# Patient Record
Sex: Female | Born: 1980 | Race: Black or African American | Hispanic: No | Marital: Single | Smoking: Never smoker
Health system: Southern US, Community
[De-identification: ages and names within clinical notes are randomized; demographics above are authoritative.]

## PROBLEM LIST (undated history)

## (undated) DIAGNOSIS — Z789 Other specified health status: Secondary | ICD-10-CM

## (undated) HISTORY — PX: APPENDECTOMY: SHX54

## (undated) HISTORY — DX: Other specified health status: Z78.9

---

## 2008-11-06 ENCOUNTER — Emergency Department (HOSPITAL_COMMUNITY): Admission: EM | Admit: 2008-11-06 | Discharge: 2008-11-06 | Payer: Self-pay | Admitting: Family Medicine

## 2010-11-06 LAB — POCT URINALYSIS DIP (DEVICE)
Glucose, UA: NEGATIVE mg/dL
Hgb urine dipstick: NEGATIVE
Ketones, ur: NEGATIVE mg/dL
Protein, ur: NEGATIVE mg/dL
Specific Gravity, Urine: >=1.03 (ref 1.005–1.030)
Urobilinogen, UA: 0.2 mg/dL (ref 0.0–1.0)

## 2010-11-06 LAB — POCT PREGNANCY, URINE: Preg Test, Ur: NEGATIVE

## 2015-05-25 ENCOUNTER — Emergency Department (HOSPITAL_COMMUNITY)
Admission: EM | Admit: 2015-05-25 | Discharge: 2015-05-25 | Disposition: A | Discharge status: Home / Self Care | Payer: Self-pay | Attending: Emergency Medicine | Admitting: Emergency Medicine

## 2015-05-25 ENCOUNTER — Encounter (HOSPITAL_COMMUNITY): Payer: Self-pay | Admitting: *Deleted

## 2015-05-25 ENCOUNTER — Emergency Department (HOSPITAL_COMMUNITY): Payer: Self-pay

## 2015-05-25 DIAGNOSIS — O9989 Other specified diseases and conditions complicating pregnancy, childbirth and the puerperium: Secondary | ICD-10-CM | POA: Insufficient documentation

## 2015-05-25 DIAGNOSIS — O210 Mild hyperemesis gravidarum: Secondary | ICD-10-CM | POA: Insufficient documentation

## 2015-05-25 DIAGNOSIS — Z3A13 13 weeks gestation of pregnancy: Secondary | ICD-10-CM | POA: Insufficient documentation

## 2015-05-25 DIAGNOSIS — Z3490 Encounter for supervision of normal pregnancy, unspecified, unspecified trimester: Secondary | ICD-10-CM

## 2015-05-25 DIAGNOSIS — R1031 Right lower quadrant pain: Secondary | ICD-10-CM | POA: Insufficient documentation

## 2015-05-25 DIAGNOSIS — Z79899 Other long term (current) drug therapy: Secondary | ICD-10-CM | POA: Insufficient documentation

## 2015-05-25 DIAGNOSIS — O200 Threatened abortion: Secondary | ICD-10-CM | POA: Insufficient documentation

## 2015-05-25 LAB — URINE MICROSCOPIC-ADD ON

## 2015-05-25 LAB — ABO/RH: ABO/RH(D): O POS

## 2015-05-25 LAB — URINALYSIS, ROUTINE W REFLEX MICROSCOPIC
GLUCOSE, UA: NEGATIVE mg/dL
HGB URINE DIPSTICK: NEGATIVE
Ketones, ur: >80 mg/dL — AB
Nitrite: NEGATIVE
PH: 6 (ref 5.0–8.0)
Protein, ur: 30 mg/dL — AB
SPECIFIC GRAVITY, URINE: 1.033 — AB (ref 1.005–1.030)
Urobilinogen, UA: 0.2 mg/dL (ref 0.0–1.0)

## 2015-05-25 LAB — CBC
HEMATOCRIT: 33.4 % — AB (ref 36.0–46.0)
Hemoglobin: 11.2 g/dL — ABNORMAL LOW (ref 12.0–15.0)
MCH: 29.9 pg (ref 26.0–34.0)
MCHC: 33.5 g/dL (ref 30.0–36.0)
MCV: 89.3 fL (ref 78.0–100.0)
Platelets: 244 10*3/uL (ref 150–400)
RBC: 3.74 MIL/uL — ABNORMAL LOW (ref 3.87–5.11)
RDW: 13.8 % (ref 11.5–15.5)
WBC: 6 10*3/uL (ref 4.0–10.5)

## 2015-05-25 LAB — CBC WITH DIFFERENTIAL/PLATELET
Basophils Absolute: 0 10*3/uL (ref 0.0–0.1)
Basophils Relative: 0 %
EOS PCT: 0 %
Eosinophils Absolute: 0 10*3/uL (ref 0.0–0.7)
HCT: 33.4 % — ABNORMAL LOW (ref 36.0–46.0)
HEMOGLOBIN: 11 g/dL — AB (ref 12.0–15.0)
Lymphocytes Relative: 23 %
Lymphs Abs: 1.5 10*3/uL (ref 0.7–4.0)
MCH: 29.3 pg (ref 26.0–34.0)
MCHC: 32.9 g/dL (ref 30.0–36.0)
MCV: 88.8 fL (ref 78.0–100.0)
Monocytes Absolute: 0.6 10*3/uL (ref 0.1–1.0)
Monocytes Relative: 10 %
NEUTROS PCT: 67 %
Neutro Abs: 4.4 10*3/uL (ref 1.7–7.7)
PLATELETS: 239 10*3/uL (ref 150–400)
RBC: 3.76 MIL/uL — AB (ref 3.87–5.11)
RDW: 13.9 % (ref 11.5–15.5)
WBC: 6.6 10*3/uL (ref 4.0–10.5)

## 2015-05-25 LAB — COMPREHENSIVE METABOLIC PANEL
ALBUMIN: 3.9 g/dL (ref 3.5–5.0)
ALT: 10 U/L — ABNORMAL LOW (ref 14–54)
AST: 15 U/L (ref 15–41)
Alkaline Phosphatase: 44 U/L (ref 38–126)
Anion gap: 11 (ref 5–15)
BILIRUBIN TOTAL: 0.7 mg/dL (ref 0.3–1.2)
BUN: 6 mg/dL (ref 6–20)
CO2: 19 mmol/L — AB (ref 22–32)
Calcium: 9.2 mg/dL (ref 8.9–10.3)
Chloride: 102 mmol/L (ref 101–111)
Creatinine, Ser: 0.53 mg/dL (ref 0.44–1.00)
GFR calc Af Amer: >60 mL/min (ref >60)
GFR calc non Af Amer: >60 mL/min (ref >60)
GLUCOSE: 86 mg/dL (ref 65–99)
POTASSIUM: 3.5 mmol/L (ref 3.5–5.1)
SODIUM: 132 mmol/L — AB (ref 135–145)
TOTAL PROTEIN: 7 g/dL (ref 6.5–8.1)

## 2015-05-25 LAB — BASIC METABOLIC PANEL
Anion gap: 10 (ref 5–15)
BUN: 5 mg/dL — AB (ref 6–20)
CHLORIDE: 105 mmol/L (ref 101–111)
CO2: 20 mmol/L — ABNORMAL LOW (ref 22–32)
CREATININE: 0.5 mg/dL (ref 0.44–1.00)
Calcium: 9.4 mg/dL (ref 8.9–10.3)
GFR calc Af Amer: >60 mL/min (ref >60)
GFR calc non Af Amer: >60 mL/min (ref >60)
GLUCOSE: 68 mg/dL (ref 65–99)
POTASSIUM: 4.1 mmol/L (ref 3.5–5.1)
Sodium: 135 mmol/L (ref 135–145)

## 2015-05-25 LAB — WET PREP, GENITAL
Trich, Wet Prep: NONE SEEN
Yeast Wet Prep HPF POC: NONE SEEN

## 2015-05-25 LAB — HCG, QUANTITATIVE, PREGNANCY: hCG, Beta Chain, Quant, S: 122388 m[IU]/mL — ABNORMAL HIGH (ref <5)

## 2015-05-25 LAB — POC URINE PREG, ED: Preg Test, Ur: POSITIVE — AB

## 2015-05-25 LAB — CBG MONITORING, ED: Glucose-Capillary: 70 mg/dL (ref 65–99)

## 2015-05-25 IMAGING — US US OB TRANSVAGINAL
1 series · 14 of 28 positions shown · non-contrast
Comparison: None.

CLINICAL DATA: First trimester pregnancy, acute pelvic pain.

EXAM:
OBSTETRIC <14 WK US AND TRANSVAGINAL OB US
TECHNIQUE: Both transabdominal and transvaginal ultrasound examinations were
performed for complete evaluation of the gestation as well as the
maternal uterus, adnexal regions, and pelvic cul-de-sac.
Transvaginal technique was performed to assess early pregnancy.

[Series 1: us ob transvaginal · 0.26mm/px · 14 of 88 slices shown]
[im 4/88]
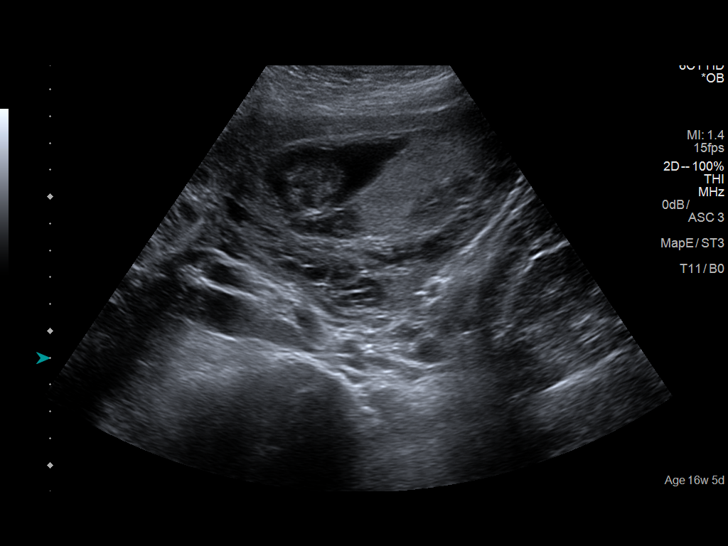
[im 10/88]
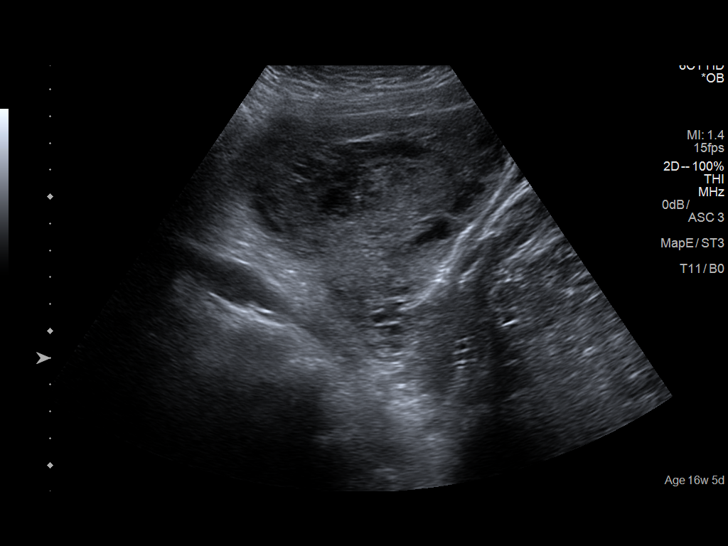
[im 17/88]
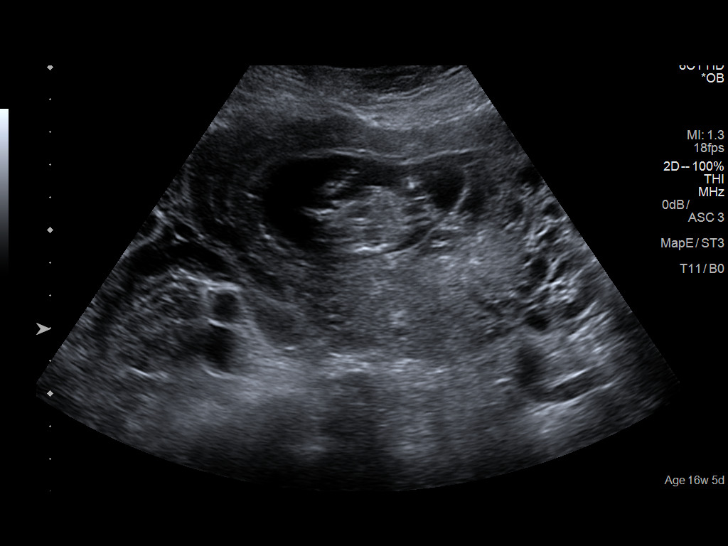
[im 23/88]
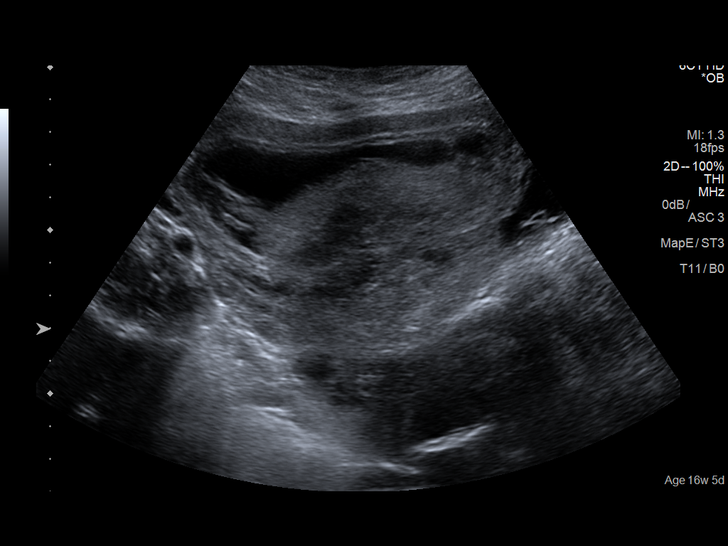
[im 30/88]
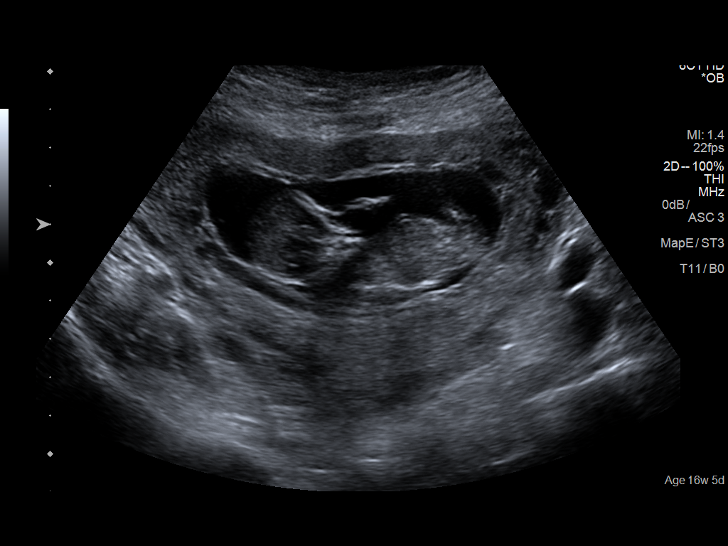
[im 36/88]
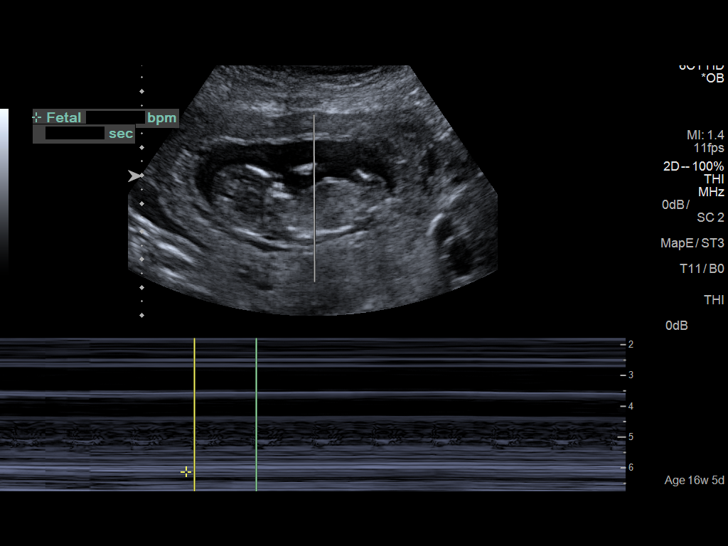
[im 42/88]
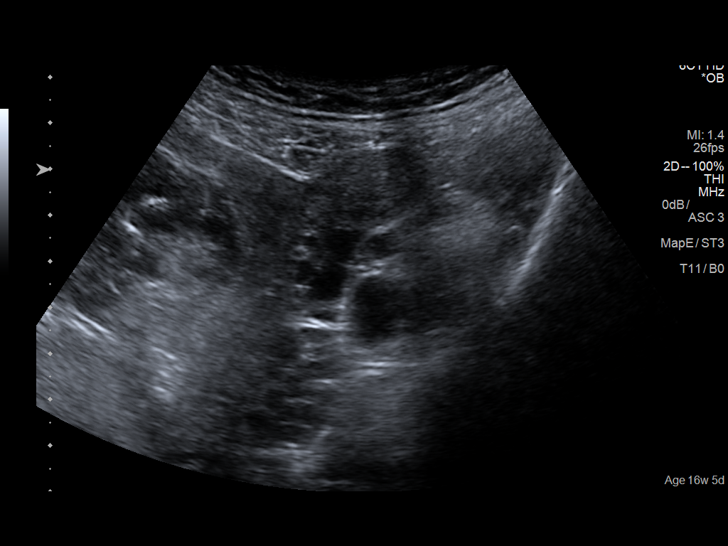
[im 49/88]
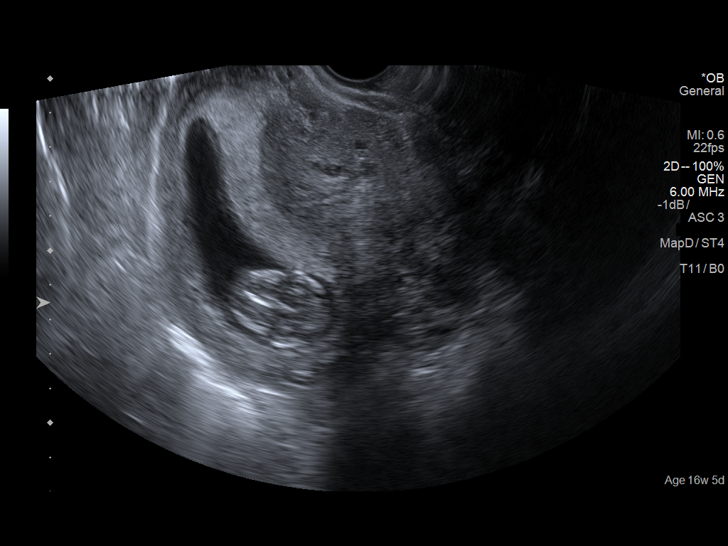
[im 55/88]
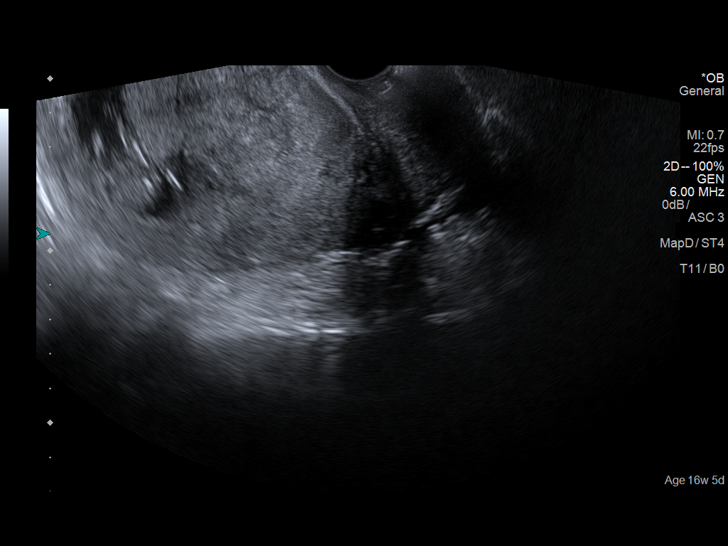
[im 62/88]
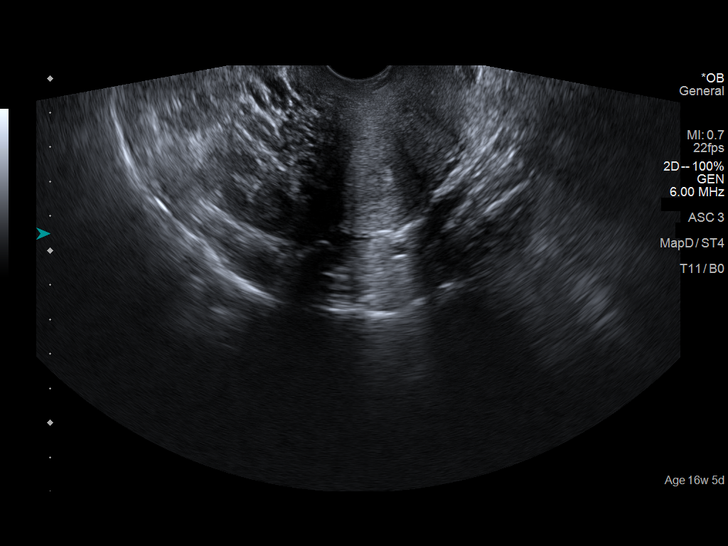
[im 68/88]
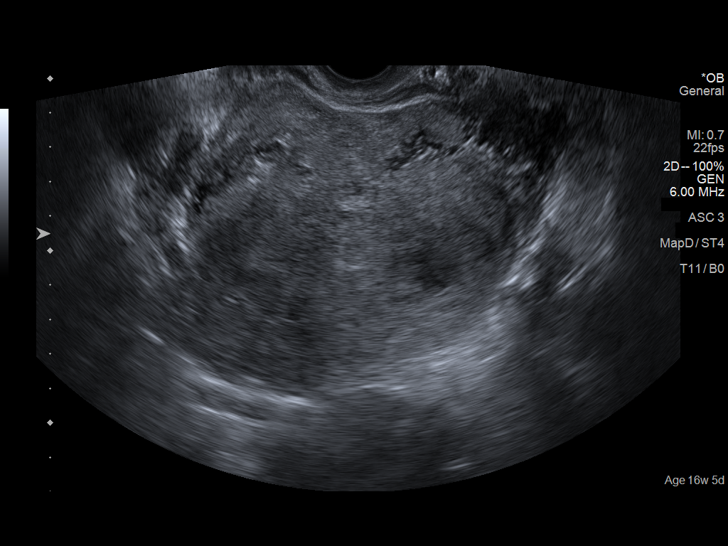
[im 75/88]
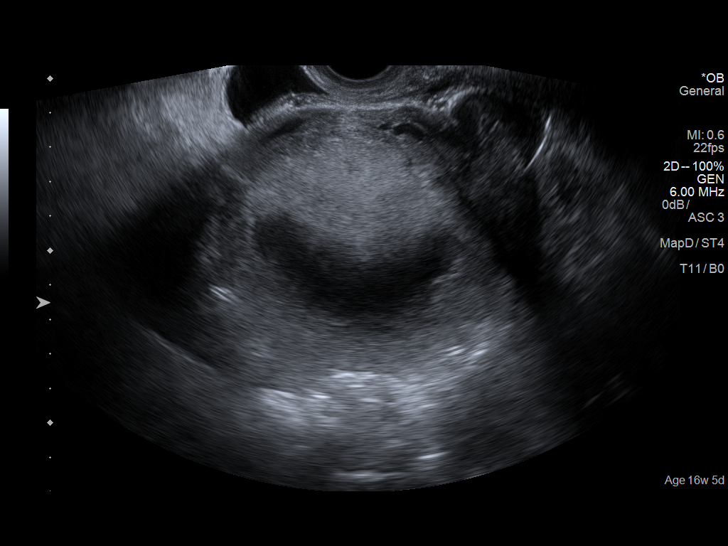
[im 81/88]
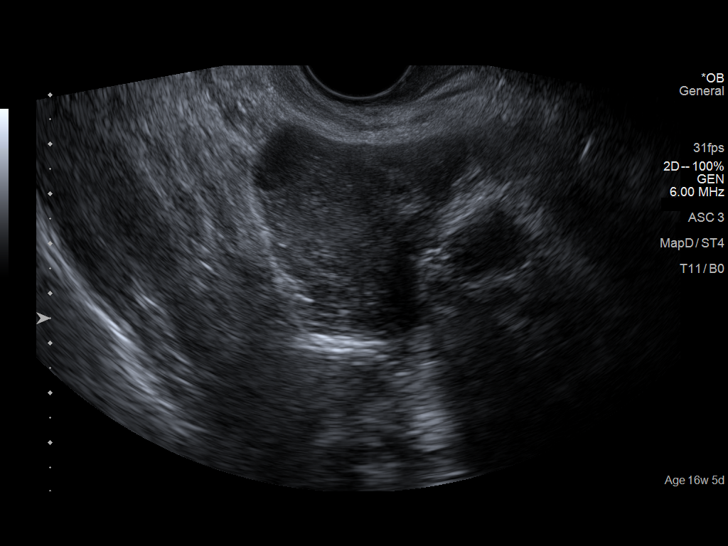
[im 88/88]
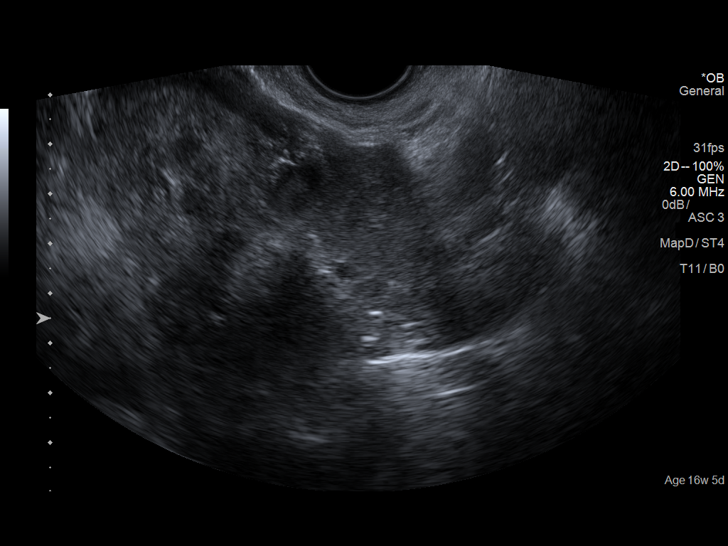

[14 of 28 positions shown; findings below may reference images not displayed]

FINDINGS: Intrauterine gestational sac: Visualized/normal in shape.

Yolk sac:  Not visualized.

Embryo:  Visualized.

Cardiac Activity: Visualized.

Heart Rate: 165  bpm

CRL:  65  mm 12  w 6 d

Maternal uterus/adnexae: Left ovary appears normal. Right ovary is
not visualized. Trace free fluid is noted which most likely is
physiologic.
IMPRESSION: Single live intrauterine gestation of 12 weeks 6 days. This exam was
targeted toward the presenting symptom of pain. Routine fetal
screening was not performed, and therefore congenital anomalies or
birth defects cannot be excluded on the basis of this exam. It is
recommended that routine screening be performed at the appropriate
time by the patient's obstetrician.

## 2015-05-25 MED ORDER — PROMETHAZINE HCL 25 MG/ML IJ SOLN
12.5000 mg | Freq: Once | INTRAMUSCULAR | Status: AC
  Administered 2015-05-25: 12.5 mg via INTRAVENOUS
  Filled 2015-05-25: qty 1

## 2015-05-25 MED ORDER — METOCLOPRAMIDE HCL 5 MG/ML IJ SOLN
10.0000 mg | Freq: Once | INTRAMUSCULAR | Status: DC
  Filled 2015-05-25: qty 2

## 2015-05-25 MED ORDER — DOXYLAMINE SUCCINATE (SLEEP) 25 MG PO TABS: 25.0000 mg | ORAL_TABLET | Freq: Once | ORAL | Status: DC

## 2015-05-25 MED ORDER — PYRIDOXINE HCL 100 MG/ML IJ SOLN
50.0000 mg | Freq: Once | INTRAMUSCULAR | Status: AC
  Administered 2015-05-25: 50 mg via INTRAVENOUS
  Filled 2015-05-25: qty 0.5

## 2015-05-25 MED ORDER — METOCLOPRAMIDE HCL 10 MG PO TABS: 10.0000 mg | ORAL_TABLET | Freq: Once | ORAL | Status: DC

## 2015-05-25 MED ORDER — ONDANSETRON HCL 4 MG/2ML IJ SOLN
4.0000 mg | Freq: Once | INTRAMUSCULAR | Status: DC
Start: 2015-05-25 — End: 2015-05-25

## 2015-05-25 MED ORDER — SODIUM CHLORIDE 0.9 % IV SOLN
INTRAVENOUS | Status: DC
  Administered 2015-05-25: 17:00:00 via INTRAVENOUS

## 2015-05-25 MED ORDER — PROMETHAZINE HCL 12.5 MG PO TABS: 12.5000 mg | ORAL_TABLET | Freq: Four times a day (QID) | ORAL | Status: AC | PRN

## 2015-05-25 MED ORDER — DOXYLAMINE-PYRIDOXINE 10-10 MG PO TBEC: 1.0000 | DELAYED_RELEASE_TABLET | Freq: Every day | ORAL | Status: AC

## 2015-05-25 MED ORDER — SODIUM CHLORIDE 0.9 % IV BOLUS (SEPSIS)
1000.0000 mL | Freq: Once | INTRAVENOUS | Status: AC
  Administered 2015-05-25: 1000 mL via INTRAVENOUS

## 2015-05-25 NOTE — ED Notes (Signed)
Pt reports being [redacted] weeks pregnant, was seen here this am for abd pain and had US done which was normal. Pt reports going home and then sudden onset of heavy vaginal bleeding.

## 2015-05-25 NOTE — Discharge Instructions (Signed)
[facility] symptoms wor[facility].

## 2015-05-25 NOTE — ED Notes (Signed)
No pelvic exam perfromed earlier today

## 2015-05-25 NOTE — ED Notes (Signed)
MD at bedside. 

## 2015-05-25 NOTE — ED Notes (Addendum)
Pt reports no prenatal care at this point in her pregnancy, has an appt next week.  Pt reports she is approximately [redacted] weeks pregnant.

## 2015-05-25 NOTE — ED Notes (Signed)
The pt is 12 weeks preg and she was seen here this am and had a full work-up.  C/o nausea

## 2015-05-25 NOTE — ED Provider Notes (Signed)
Medical screening examination/treatment/procedure(s) were conducted as a shared visit with non-physician practitioner(s) and myself.  I personally evaluated the patient during the encounter.  A 34-year-old female seen here earlier today with pelvic pain and a normal ultrasound. Comes back after being on for approximately 30 minutes she started having vaginal bleeding that persisted until arrival here. Here her vital signs are normal the limit of pelvic cramping but no significant tenderness to palpation. She is alert and oriented no other abnormalities on exam. Ultrasound performed by myself as documented below. Diagnoses likely threatened abortion. Bleeding precautions and close follow-up with obstetrics will be given.  EMERGENCY DEPARTMENT US PREGNANCY "Study: Limited Ultrasound of the Pelvis for Pregnancy"  INDICATIONS:Pregnancy(required), Vaginal bleeding and Pelvic pain Multiple views of the uterus and pelvic cavity were obtained in real-time with a multi-frequency probe.  APPROACH:Transabdominal   PERFORMED BY: Myself  IMAGES ARCHIVED?: Yes  LIMITATIONS: Body habitus  PREGNANCY FREE FLUID: None  ADNEXAL FINDINGS:Left ovary not seen  PREGNANCY FINDINGS: Fetal heart activity seen  INTERPRETATION: Viable intrauterine pregnancy and Pelvic free fluid absent  GESTATIONAL AGE, ESTIMATE: 12.1  FETAL HEART RATE: 167  CPT Codes:  16109-6076815-26 (transabdominal OB)  768-26-52 (transvaginal OB, Reduced level of service for incomplete exam)      Marily MemosJason Lesa Vandall, MD 05/25/15 [id number]

## 2015-05-25 NOTE — ED Notes (Signed)
Patient transported to Ultrasound 

## 2015-05-25 NOTE — ED Notes (Signed)
Pt presents via POV c/o lower abdominal pain/N/V/D x 2 days.  Pt reports she is [redacted] weeks pregnant. Denies vaginal bleeding, reports vaginal discharge.  Pt a x 4, NAD.

## 2015-05-25 NOTE — ED Notes (Signed)
[facility] CALLED TO GET MED SENT

## 2015-05-25 NOTE — ED Provider Notes (Signed)
Arrival Date & Time: 05/25/15 & 0825 History  HPI Limitations: none. Chief Complaint  Patient presents with  . Abdominal Pain   HPI Erica Parks is a 34 y.o. female with a chief complaint of Abdominal Pain  patient presents for assessment of lower pelvic cramping described as mild and radiating into pelvis patient denies fevers or chills increased vaginal discharge and states no vaginal bleeding. Patient has had 5 prior pregnancies and delivered all term vaginally without palpitation or concerns for elevated blood pressure. Patient had miscarriage in April and he is currently pregnant and had urine pregnancy that was positive. Has had no ultrasound screening for intrauterine pregnancy yet. No prenatal care yet. Patient has had nausea vomiting and loose stools for 2 days and states that she is hungry however unable to keep food down.  Past Medical History  I reviewed & agree with nursing's documentation on PMHx, PSHx, SHx and FHx. History reviewed. No pertinent past medical history. Past Surgical History  Procedure Laterality Date  . Appendectomy     Social History   Social History  . Marital Status: Single    Spouse Name: N/A  . Number of Children: N/A  . Years of Education: N/A   Social History Main Topics  . Smoking status: Never Smoker   . Smokeless tobacco: None  . Alcohol Use: No  . Drug Use: No  . Sexual Activity: Not Asked   Other Topics Concern  . None   Social History Narrative  . None   No family history on file.  Review of Systems  Complete ROS obtained and pertinent positive and negatives documented above in HPI. All other ROS negative.  Allergies  Review of patient's allergies indicates no known allergies.  Home Medications   Prior to Admission medications   Medication Sig Start Date End Date Taking? Authorizing Provider  Doxylamine-Pyridoxine (DICLEGIS) 10-10 MG TBEC Take 1 tablet by mouth daily. Take 2 tablets orally on empty stomach at bedtime on  day 1 and 2.  If symptoms persist, take 1 tablet in morning and 2 tablets at bedtime on day 3.  If symptoms persist may increase to MAX 4 tablets per day, administered as 1 tablet in the morning, 1 tablet in the mid-afternoon and 2 tablets at bedtime. 05/25/15   Alvira Monday, MD  prenatal vitamin w/FE, FA (NATACHEW) 29-1 MG CHEW chewable tablet Chew 1 tablet by mouth daily at 12 noon.   Yes Historical Provider, MD    Physical Exam  BP 112/61 mmHg  Pulse 77  Temp(Src) 98.4 F (36.9 C) (Oral)  Resp 17  Ht  (1.854 m)  Wt 175 lb (79.379 kg)  BMI 23.09 kg/m2  SpO2 100% Physical Exam  Constitutional: She is oriented to person, place, and time. She appears well-developed and well-nourished.  Non-toxic appearance. She does not appear ill. No distress.  HENT:  Head: Normocephalic and atraumatic.  Right Ear: External ear normal.  Left Ear: External ear normal.  Eyes: Pupils are equal, round, and reactive to light. No scleral icterus.  Neck: Normal range of motion. Neck supple. No tracheal deviation present.  Cardiovascular: Normal heart sounds and intact distal pulses.   No murmur heard. Pulmonary/Chest: Effort normal and breath sounds normal. No stridor. No respiratory distress. She has no wheezes. She has no rales.  Abdominal: Soft. Bowel sounds are normal. She exhibits no distension. There is tenderness (mild lower pelvic ttp. gravid abdomen.). There is no rebound and no guarding.  Musculoskeletal: Normal range of  motion.  Neurological: She is alert and oriented to person, place, and time. She has normal strength and normal reflexes. No cranial nerve deficit or sensory deficit.  Skin: Skin is warm and dry. No pallor.  Psychiatric: She has a normal mood and affect. Her behavior is normal.    ED Course  Procedures Labs Review Labs Reviewed  COMPREHENSIVE METABOLIC PANEL - Abnormal; Notable for the following:    Sodium 132 (*)    CO2 19 (*)    ALT 10 (*)    All other components  within normal limits  CBC - Abnormal; Notable for the following:    RBC 3.74 (*)    Hemoglobin 11.2 (*)    HCT 33.4 (*)    All other components within normal limits  URINALYSIS, ROUTINE W REFLEX MICROSCOPIC (NOT AT [facility]) - Abnormal; Notable for the following:    Color, Urine AMBER (*)    APPearance CLOUDY (*)    Specific Gravity, Urine 1.033 (*)    Bilirubin Urine SMALL (*)    Ketones, ur >80 (*)    Protein, ur 30 (*)    Leukocytes, UA SMALL (*)    All other components within normal limits  HCG, QUANTITATIVE, PREGNANCY - Abnormal; Notable for the following:    hCG, Beta Chain, Mahalia LongestQuant, S [id number] (*)    All other components within normal limits  URINE MICROSCOPIC-ADD ON - Abnormal; Notable for the following:    Squamous Epithelial / LPF MANY (*)    Crystals CA OXALATE CRYSTALS (*)    All other components within normal limits  POC URINE PREG, ED - Abnormal; Notable for the following:    Preg Test, Ur POSITIVE (*)    All other components within normal limits  CBG MONITORING, ED  ABO/RH    Imaging Review [place] Ob Comp Less 14 Wks  05/25/2015  CLINICAL DATA:  First trimester pregnancy, acute pelvic pain. EXAM: OBSTETRIC <14 WK US AND TRANSVAGINAL OB US TECHNIQUE: Both transabdominal and transvaginal ultrasound examinations were performed for complete evaluation of the gestation as well as the maternal uterus, adnexal regions, and pelvic cul-de-sac. Transvaginal technique was performed to assess early pregnancy. COMPARISON:  None. FINDINGS: Intrauterine gestational sac: Visualized/normal in shape. Yolk sac:  Not visualized. Embryo:  Visualized. Cardiac Activity: Visualized. Heart Rate: 165  bpm CRL:  65  mm 12  w 6 d Maternal uterus/adnexae: Left ovary appears normal. Right ovary is not visualized. Trace free fluid is noted which most likely is physiologic. IMPRESSION: Single live intrauterine gestation of 12 weeks 6 days. This exam was targeted toward the presenting symptom of pain. Routine  fetal screening was not performed, and therefore congenital anomalies or birth defects cannot be excluded on the basis of this exam. It is recommended that routine screening be performed at the appropriate time by the patient's obstetrician. Electronically Signed   By: Lupita RaiderJames  Green Jr, M.D.   On: 05/25/2015 11:24   [place] Ob Transvaginal  05/25/2015  CLINICAL DATA:  First trimester pregnancy, acute pelvic pain. EXAM: OBSTETRIC <14 WK US AND TRANSVAGINAL OB US TECHNIQUE: Both transabdominal and transvaginal ultrasound examinations were performed for complete evaluation of the gestation as well as the maternal uterus, adnexal regions, and pelvic cul-de-sac. Transvaginal technique was performed to assess early pregnancy. COMPARISON:  None. FINDINGS: Intrauterine gestational sac: Visualized/normal in shape. Yolk sac:  Not visualized. Embryo:  Visualized. Cardiac Activity: Visualized. Heart Rate: 165  bpm CRL:  65  mm 12  w 6 d Maternal uterus/adnexae:  Left ovary appears normal. Right ovary is not visualized. Trace free fluid is noted which most likely is physiologic. IMPRESSION: Single live intrauterine gestation of 12 weeks 6 days. This exam was targeted toward the presenting symptom of pain. Routine fetal screening was not performed, and therefore congenital anomalies or birth defects cannot be excluded on the basis of this exam. It is recommended that routine screening be performed at the appropriate time by the patient's obstetrician. Electronically Signed   By: Lupita Raider, M.D.   On: 05/25/2015 11:24    Laboratory and Imaging results were personally reviewed by myself and used in the medical decision making of this patient's treatment and disposition.  EKG Interpretation  EKG Interpretation  Date/Time:    Ventricular Rate:    PR Interval:    QRS Duration:   QT Interval:    QTC Calculation:   R Axis:     Text Interpretation:        MDM  Mckenzy Salazar is a 34 y.o. female with H&P as above.  ED clinical course as follows:  Patient's laboratory work including labs and urine studies reveal no remarkable concerns at this time except for demonstration of dehydration and therefore patient was given intravenous access and supplied with intravenous fluid bolus. Patient states that symptoms are immediately better with fluid bolus along with intravenous administration of antiemetics. Patient is seen tolerating by mouth and no longer divorces significant nausea or abdominal complaints at this time. Patient has appendix removed has no concerning abdominal exam findings vital signs stable and patient is afebrile without toxic appearance. Patient had first trimester ultrasound which revealed intrauterine gestational sac with heart rate fetal of 165 and no other concerning findings on ultrasound therefore patient is appropriate for discharge at this time and will follow up with the saturation gynecology at scheduled appointment on November 3. Patient has no further concerns or questions return precautions discussed in great detail patient states understanding. Patient discharged with diclegis for nausea symptomatic relief.  Clinical Impression:  1. Right lower quadrant abdominal pain   2. Pregnancy   3. Intrauterine pregnancy   4. Morning sickness    Disposition:  D/c to follow up with OB/GYN  Patient care discussed with Dr. Dalene Seltzer, who oversaw their evaluation & treatment & voiced agreement. House Officer: Jonette Eva, MD, Emergency Medicine.  Jonette Eva, MD 05/25/15 1229  Jonette Eva, MD 05/25/15 1229  Alvira Monday, MD 05/26/15 [phone]

## 2015-05-25 NOTE — ED Provider Notes (Signed)
CSN: [id number]   Arrival date & time 05/25/15 1606  History  By signing my name below, I, Erica Parks, attest that this documentation has been prepared under the direction and in the presence of Kerrie Buffalo, NP. Electronically Signed: Bethel Parks, ED Scribe. 05/25/2015. 4:51 PM. Chief Complaint  Patient presents with  . Vaginal Bleeding    HPI Patient is a 34 y.o. female presenting with vaginal bleeding. The history is provided by the patient. No language interpreter was used.  Vaginal Bleeding Quality:  Bright red Severity:  Moderate Onset quality:  Sudden Timing:  Constant Progression:  Unchanged Chronicity:  New Possible pregnancy: Known pregnancy.   Context: spontaneously   Relieved by:  Nothing Worsened by:  Nothing tried Ineffective treatments:  None tried Associated symptoms: abdominal pain and nausea   Risk factors: hx of ectopic pregnancy    Erica Parks is a 34 y.o. [id number]  week 6 day gestation who presents to the Emergency Department complaining of new and constant vaginal bleeding with sudden onset this afternoon. Pt was seen in the ED for evaluation of abdominal cramping where she had an US showing a 12 week 6 day IUP but did not have a pelvic exam. After being discharged she was resting when she felt a "gush" of liquid. After going to the restroom she noticed that she was bleeding. Her abdominal pain is currently rated 9/10 in severity and described as like labor pain. Associate symptoms include nausea and vomiting. She has no local OB/GYN as her other children were Parks elsewhere. Her last pap smear was in September.   History reviewed. No pertinent past medical history.  Past Surgical History  Procedure Laterality Date  . Appendectomy      History reviewed. No pertinent family history.  Social History  Substance Use Topics  . Smoking status: Never Smoker   . Smokeless tobacco: None  . Alcohol Use: No     Review of Systems  Gastrointestinal:  Positive for nausea, vomiting and abdominal pain.  Genitourinary: Positive for vaginal bleeding.  All other systems reviewed and are negative.   Home Medications   Prior to Admission medications   Medication Sig Start Date End Date Taking? Authorizing Provider  Doxylamine-Pyridoxine (DICLEGIS) 10-10 MG TBEC Take 1 tablet by mouth daily. Take 2 tablets orally on empty stomach at bedtime on day 1 and 2.  If symptoms persist, take 1 tablet in morning and 2 tablets at bedtime on day 3.  If symptoms persist may increase to MAX 4 tablets per day, administered as 1 tablet in the morning, 1 tablet in the mid-afternoon and 2 tablets at bedtime. 05/25/15   Alvira Monday, MD  prenatal vitamin w/FE, FA (NATACHEW) 29-1 MG CHEW chewable tablet Chew 1 tablet by mouth daily at 12 noon.    Historical Provider, MD  promethazine (PHENERGAN) 12.5 MG tablet Take 1 tablet (12.5 mg total) by mouth every 6 (six) hours as needed for nausea or vomiting. 05/25/15   Tatiyana Foucher Orlene Och, NP    Allergies  Review of patient's allergies indicates no known allergies.  Triage Vitals: BP 100/59 mmHg  Pulse 94  Temp(Src) 98.3 F (36.8 C) (Oral)  Resp 18  SpO2 99%  Physical Exam  Constitutional: She is oriented to person, place, and time. She appears well-developed and well-nourished. No distress.  HENT:  Head: Normocephalic and atraumatic.  Eyes: EOM are normal.  Neck: Neck supple.  Cardiovascular: Normal rate and regular rhythm.   Pulmonary/Chest: Effort normal.  Abdominal: Soft.  There is tenderness.  Lower abdominal tenderness. Scar midline lower abdomen from previous surgery.   Genitourinary:  External genitalia without lesions, moderate watery red fluid vaginal vault. Cervix closed, uterus consistent with dates.  Musculoskeletal: Normal range of motion.  Neurological: She is alert and oriented to person, place, and time. No cranial nerve deficit.  Skin: Skin is warm and dry.  Psychiatric: She has a normal mood and  affect. Her behavior is normal.  Nursing note and vitals reviewed.   ED Course  Procedures  DIAGNOSTIC STUDIES: Oxygen Saturation is 99% on RA,  normal by my interpretation.    COORDINATION OF CARE: 4:47 PM Discussed treatment plan which includes pelvic exam, lab work, phenergan, and IVF with pt at bedside and pt agreed to the plan.  Labs Review-  Blood type O+ Results for orders placed or performed during the hospital encounter of 05/25/15 (from the past 24 hour(s))  CBC with Differential     Status: Abnormal   Collection Time: 05/25/15  5:28 PM  Result Value Ref Range   WBC 6.6 4.0 - 10.5 K/uL   RBC 3.76 (L) 3.87 - 5.11 MIL/uL   Hemoglobin 11.0 (L) 12.0 - 15.0 g/dL   HCT 16.133.4 (L) 09.636.0 - 04.546.0 %   MCV 88.8 78.0 - 100.0 fL   MCH 29.3 26.0 - 34.0 pg   MCHC 32.9 30.0 - 36.0 g/dL   RDW 40.913.9 81.111.5 - 91.415.5 %   Platelets 239 150 - 400 K/uL   Neutrophils Relative % 67 %   Neutro Abs 4.4 1.7 - 7.7 K/uL   Lymphocytes Relative 23 %   Lymphs Abs 1.5 0.7 - 4.0 K/uL   Monocytes Relative 10 %   Monocytes Absolute 0.6 0.1 - 1.0 K/uL   Eosinophils Relative 0 %   Eosinophils Absolute 0.0 0.0 - 0.7 K/uL   Basophils Relative 0 %   Basophils Absolute 0.0 0.0 - 0.1 K/uL  Wet prep, genital     Status: Abnormal   Collection Time: 05/25/15  5:32 PM  Result Value Ref Range   Yeast Wet Prep HPF POC NONE SEEN NONE SEEN   Trich, Wet Prep NONE SEEN NONE SEEN   Clue Cells Wet Prep HPF POC FEW (A) NONE SEEN   WBC, Wet Prep HPF POC FEW (A) NONE SEEN    Consult with Dr. Jolayne Pantheronstant and will have [facility] if symptoms worsen.  Patient feeling better after phenergan and IV fluids, bleeding is minimal.  Bedside ultrasound by Dr. Clayborne DanaMesner, active IUP with cardiac activity and subjective fluid normal.  MDM  34 y.o. female with sudden gush of bright red blood and abdominal cramping stable for d/c with viable IUP identified on U/S and symptoms improving. Discussed threatened miscarriage and plan of  care. Patient voices understanding and agrees with plan. [facility]   Final diagnoses:  Threatened miscarriage    I personally performed the services described in this documentation, which was scribed in my presence. The recorded information has been reviewed and is accurate.      Bluff DaleHope M Nyaira Hodgens, NP 05/25/15 1935  Marily MemosJason Mesner, MD 05/25/15 [phone]

## 2015-05-25 NOTE — ED Notes (Signed)
Iv started med givem  Spec walked to the lab

## 2015-05-28 LAB — GC/CHLAMYDIA PROBE AMP (~~LOC~~) NOT AT ARMC
CHLAMYDIA, DNA PROBE: NEGATIVE
Neisseria Gonorrhea: NEGATIVE

## 2015-06-25 ENCOUNTER — Emergency Department (HOSPITAL_COMMUNITY)
Admission: EM | Admit: 2015-06-25 | Discharge: 2015-06-25 | Disposition: A | Discharge status: Home / Self Care | Payer: Self-pay | Attending: Emergency Medicine | Admitting: Emergency Medicine

## 2015-06-25 ENCOUNTER — Encounter (HOSPITAL_COMMUNITY): Payer: Self-pay | Admitting: Emergency Medicine

## 2015-06-25 DIAGNOSIS — O200 Threatened abortion: Secondary | ICD-10-CM | POA: Insufficient documentation

## 2015-06-25 DIAGNOSIS — Z3A17 17 weeks gestation of pregnancy: Secondary | ICD-10-CM | POA: Insufficient documentation

## 2015-06-25 LAB — CBC WITH DIFFERENTIAL/PLATELET
Basophils Absolute: 0 10*3/uL (ref 0.0–0.1)
Basophils Relative: 0 %
EOS PCT: 0 %
Eosinophils Absolute: 0 10*3/uL (ref 0.0–0.7)
HCT: 30.1 % — ABNORMAL LOW (ref 36.0–46.0)
Hemoglobin: 9.8 g/dL — ABNORMAL LOW (ref 12.0–15.0)
LYMPHS ABS: 1.2 10*3/uL (ref 0.7–4.0)
LYMPHS PCT: 17 %
MCH: 29.8 pg (ref 26.0–34.0)
MCHC: 32.6 g/dL (ref 30.0–36.0)
MCV: 91.5 fL (ref 78.0–100.0)
MONO ABS: 0.4 10*3/uL (ref 0.1–1.0)
Monocytes Relative: 6 %
Neutro Abs: 5.1 10*3/uL (ref 1.7–7.7)
Neutrophils Relative %: 77 %
PLATELETS: 273 10*3/uL (ref 150–400)
RBC: 3.29 MIL/uL — ABNORMAL LOW (ref 3.87–5.11)
RDW: 14.7 % (ref 11.5–15.5)
WBC: 6.7 10*3/uL (ref 4.0–10.5)

## 2015-06-25 LAB — WET PREP, GENITAL
Sperm: NONE SEEN
Trich, Wet Prep: NONE SEEN
Yeast Wet Prep HPF POC: NONE SEEN

## 2015-06-25 LAB — HCG, QUANTITATIVE, PREGNANCY: hCG, Beta Chain, Quant, S: 39656 m[IU]/mL — ABNORMAL HIGH (ref <5)

## 2015-06-25 NOTE — Discharge Instructions (Signed)

## 2015-06-25 NOTE — ED Provider Notes (Signed)
Pt here with vaginal bleeding x4 days initially scant and now more similar to "the beginning of a period", [redacted]wks pregnant, no prenatal care yet. Requested female provider for pelvic exam, therefore I was asked by the Resident over her care Erica Parks(Erica Parks)  to perform this. Please see his note for discussion of patient's eval/care aside from the pelvic which is noted below.  Physical Exam  BP 102/69 mmHg  Pulse 77  Temp(Src) 98.2 F (36.8 C) (Oral)  Resp 16  Ht 6\' 1"  (1.854 m)  Wt 79.379 kg  BMI 23.09 kg/m2  SpO2 100%  Physical Exam SEE PELVIC EXAM BELOW- in short, mild amount of dark vaginal blood in vault which appears to be coming from the cervical os where there is some brighter red blood noted at the os, but os is closed.  ED Course  Pelvic exam Date/Time: 06/25/2015 10:57 AM Performed by: Erica Parks, Erica Parks Authorized by: Erica Parks, Erica Parks Consent: Verbal consent obtained. Risks and benefits: risks, benefits and alternatives were discussed Consent given by: patient Patient understanding: patient states understanding of the procedure being performed Patient consent: the patient's understanding of the procedure matches consent given Patient identity confirmed: verbally with patient Preparation: Patient was prepped and draped in the usual sterile fashion. Local anesthesia used: no Patient sedated: no Patient tolerance: Patient tolerated the procedure well with no immediate complications Comments: Chaperone present for exam. No rashes, lesions, or tenderness to external genitalia. No erythema, injury, or tenderness to vaginal mucosa. No obvious vaginal discharge but mild amount of dark vaginal blood noted within vaginal vault, which appears to be coming from the cervical os with some scant bright red blood noted at the os, which is closed. No cervical friability or discharge from cervical os. Bimanual exam not performed due to pregnancy and uncertainty of placenta  location.      Erica RavensMercedes Camprubi-Soms, PA-C 06/25/15 1059  Erica PorterMark James, MD 06/26/15 2242

## 2015-06-25 NOTE — ED Provider Notes (Signed)
CSN: [id number]     Arrival date & time 06/25/15  [id number] History   First MD Initiated Contact with Patient 06/25/15 [phone]     Chief Complaint  Patient presents with  . Vaginal Bleeding    Patient is a 34 y.o. female presenting with vaginal bleeding. The history is provided by the patient.  Vaginal Bleeding Quality:  Dark red and spotting Severity:  Mild Onset quality:  Gradual Duration:  2 days Timing:  Intermittent Chronicity:  New Number of pads used:  2 Possible pregnancy: yes   Relieved by:  Nothing Associated symptoms: abdominal pain   Associated symptoms: no back pain, no dizziness, no dysuria, no fever and no nausea     History reviewed. No pertinent past medical history. Past Surgical History  Procedure Laterality Date  . Appendectomy     No family history on file. Social History  Substance Use Topics  . Smoking status: Never Smoker   . Smokeless tobacco: None  . Alcohol Use: No   OB History    Gravida Para Term Preterm AB TAB SAB Ectopic Multiple Living   1              Review of Systems  Constitutional: Negative for fever.  HENT: Negative for rhinorrhea and sore throat.   Eyes: Negative for visual disturbance.  Respiratory: Negative for chest tightness and shortness of breath.   Cardiovascular: Negative for chest pain and palpitations.  Gastrointestinal: Positive for abdominal pain. Negative for nausea, vomiting and constipation.  Genitourinary: Positive for vaginal bleeding. Negative for dysuria and hematuria.  Musculoskeletal: Negative for back pain and neck pain.  Skin: Negative for rash.  Neurological: Negative for dizziness and headaches.  Psychiatric/Behavioral: Negative for confusion.  All other systems reviewed and are negative.     Allergies  Review of patient's allergies indicates no known allergies.  Home Medications   Prior to Admission medications   Medication Sig Start Date End Date Taking? Authorizing Provider   Doxylamine-Pyridoxine (DICLEGIS) 10-10 MG TBEC Take 1 tablet by mouth daily. Take 2 tablets orally on empty stomach at bedtime on day 1 and 2.  If symptoms persist, take 1 tablet in morning and 2 tablets at bedtime on day 3.  If symptoms persist may increase to MAX 4 tablets per day, administered as 1 tablet in the morning, 1 tablet in the mid-afternoon and 2 tablets at bedtime. Patient not taking: Reported on 06/25/2015 05/25/15   Alvira MondayErin Schlossman, MD  prenatal vitamin w/FE, FA (NATACHEW) 29-1 MG CHEW chewable tablet Chew 1 tablet by mouth daily at 12 noon.    Historical Provider, MD  promethazine (PHENERGAN) 12.5 MG tablet Take 1 tablet (12.5 mg total) by mouth every 6 (six) hours as needed for nausea or vomiting. Patient not taking: Reported on 06/25/2015 05/25/15   Janne NapoleonHope M Neese, NP   BP 96/57 mmHg  Pulse 72  Temp(Src) 98.2 F (36.8 C) (Oral)  Resp 16  Ht 6\' 1"  (1.854 m)  Wt 79.379 kg  BMI 23.09 kg/m2  SpO2 100% Physical Exam  Constitutional: She is oriented to person, place, and time. She appears well-developed and well-nourished. No distress.  HENT:  Head: Normocephalic and atraumatic.  Mouth/Throat: Oropharynx is clear and moist.  Eyes: EOM are normal. Pupils are equal, round, and reactive to light.  Neck: Neck supple. No JVD present.  Cardiovascular: Normal rate, regular rhythm, normal heart sounds and intact distal pulses.  Exam reveals no gallop.   No murmur heard. Pulmonary/Chest: Effort normal  and breath sounds normal. She has no wheezes. She has no rales.  Abdominal: Soft. She exhibits no distension. There is no tenderness.  Genitourinary:  Refer to note by Allen Derry, PA-C, for pelvic exam details.  Musculoskeletal: Normal range of motion. She exhibits no tenderness.  Neurological: She is alert and oriented to person, place, and time. No cranial nerve deficit. She exhibits normal muscle tone.  Skin: Skin is warm and dry. No rash noted.  Psychiatric: Her  behavior is normal.    ED Course  Procedures  None   Labs Review Labs Reviewed  WET PREP, GENITAL - Abnormal; Notable for the following:    Clue Cells Wet Prep HPF POC PRESENT (*)    WBC, Wet Prep HPF POC MANY (*)    All other components within normal limits  CBC WITH DIFFERENTIAL/PLATELET - Abnormal; Notable for the following:    RBC 3.29 (*)    Hemoglobin 9.8 (*)    HCT 30.1 (*)    All other components within normal limits  HCG, QUANTITATIVE, PREGNANCY - Abnormal; Notable for the following:    hCG, Beta Chain, Quant, S 16109 (*)    All other components within normal limits  GC/CHLAMYDIA PROBE AMP ([place]) NOT AT [facility]   MDM   Final diagnoses:  Threatened abortion in second trimester    34 yo AAF approx [redacted] weeks pregnant presents with vaginal bleeding x 2 days. She has gone through 2 pads. Denies systemic symtpoms of anemia. Lower abdominal cramping, mild. Denies vaginal discharge, urinary or bowel symptoms. No new sexual partners. Has not had sex during or immediately prior to bleeding. No fevers. Feels fetal movement. FHR 160s. HGC appropriate for this stage in pregnancy. Wet prep with clue cells. Patient desired female provider to perform pelvic exam, see other provider note for details. Suspect contaminate. Hgb stable. [place] last month showed viable pregnancy. Pt has f/u with OBGYN next week. Strict return precautions provided. Patient urged to keep OB appt. Stable for d/c.  Discussed with Dr. Theophilus Bones, MD 06/25/15 1448  Rolland Porter, MD 06/26/15 2242

## 2015-06-25 NOTE — ED Provider Notes (Signed)
Pt with VB.  Clinically c closed os.  +FHT. O+ by history.  Anamia noted.  F/U c OB, ER with acute changes.  Rolland PorterMark Jasey Cortez, MD 06/25/15 1233

## 2015-06-25 NOTE — ED Notes (Signed)
To ED with c/o vaginal bleeding started on thanksgiving, is [redacted] weeks pregnant, has not had any prenatal care yet, has appt on Dec 7th with OB. Is new to [place]. States has gone through 2 pads (normal size) since yesterday, had a miscarraige in April. 7th pregnancy, has 5 children.

## 2015-06-26 LAB — GC/CHLAMYDIA PROBE AMP (~~LOC~~) NOT AT ARMC
Chlamydia: NEGATIVE
Neisseria Gonorrhea: NEGATIVE

## 2015-07-10 ENCOUNTER — Ambulatory Visit (INDEPENDENT_AMBULATORY_CARE_PROVIDER_SITE_OTHER): Payer: Self-pay | Admitting: Student

## 2015-07-10 ENCOUNTER — Encounter: Payer: Self-pay | Admitting: Student

## 2015-07-10 VITALS — BP 107/58 | HR 78 | Wt 187.4 lb

## 2015-07-10 DIAGNOSIS — Z3482 Encounter for supervision of other normal pregnancy, second trimester: Secondary | ICD-10-CM

## 2015-07-10 DIAGNOSIS — K1120 Sialoadenitis, unspecified: Secondary | ICD-10-CM

## 2015-07-10 DIAGNOSIS — Z3492 Encounter for supervision of normal pregnancy, unspecified, second trimester: Secondary | ICD-10-CM | POA: Insufficient documentation

## 2015-07-10 DIAGNOSIS — O99612 Diseases of the digestive system complicating pregnancy, second trimester: Secondary | ICD-10-CM

## 2015-07-10 DIAGNOSIS — K117 Disturbances of salivary secretion: Secondary | ICD-10-CM

## 2015-07-10 LAB — POCT URINALYSIS DIP (DEVICE)
Bilirubin Urine: NEGATIVE
GLUCOSE, UA: NEGATIVE mg/dL
Hgb urine dipstick: NEGATIVE
Ketones, ur: NEGATIVE mg/dL
Leukocytes, UA: NEGATIVE
NITRITE: NEGATIVE
PROTEIN: NEGATIVE mg/dL
Specific Gravity, Urine: >=1.03 (ref 1.005–1.030)
Urobilinogen, UA: 0.2 mg/dL (ref 0.0–1.0)
pH: 6 (ref 5.0–8.0)

## 2015-07-10 MED ORDER — GLYCOPYRROLATE 1 MG PO TABS: 1.0000 mg | ORAL_TABLET | Freq: Three times a day (TID) | ORAL | Status: AC

## 2015-07-10 NOTE — Patient Instructions (Signed)
Second Trimester of Pregnancy The second trimester is from week 13 through week 28, months 4 through 6. The second trimester is often a time when you feel your best. Your body has also adjusted to being pregnant, and you begin to feel better physically. Usually, morning sickness has lessened or quit completely, you may have more energy, and you may have an increase in appetite. The second trimester is also a time when the fetus is growing rapidly. At the end of the sixth month, the fetus is about 9 inches long and weighs about 1 pounds. You will likely begin to feel the baby move (quickening) between 18 and 20 weeks of the pregnancy. BODY CHANGES Your body goes through many changes during pregnancy. The changes vary from woman to woman.   Your weight will continue to increase. You will notice your lower abdomen bulging out.  You may begin to get stretch marks on your hips, abdomen, and breasts.  You may develop headaches that can be relieved by medicines approved by your health care provider.  You may urinate more often because the fetus is pressing on your bladder.  You may develop or continue to have heartburn as a result of your pregnancy.  You may develop constipation because certain hormones are causing the muscles that push waste through your intestines to slow down.  You may develop hemorrhoids or swollen, bulging veins (varicose veins).  You may have back pain because of the weight gain and pregnancy hormones relaxing your joints between the bones in your pelvis and as a result of a shift in weight and the muscles that support your balance.  Your breasts will continue to grow and be tender.  Your gums may bleed and may be sensitive to brushing and flossing.  Dark spots or blotches (chloasma, mask of pregnancy) may develop on your face. This will likely fade after the baby is born.  A dark line from your belly button to the pubic area (linea nigra) may appear. This will likely fade  after the baby is born.  You may have changes in your hair. These can include thickening of your hair, rapid growth, and changes in texture. Some women also have hair loss during or after pregnancy, or hair that feels dry or thin. Your hair will most likely return to normal after your baby is born. WHAT TO EXPECT AT YOUR PRENATAL VISITS During a routine prenatal visit:  You will be weighed to make sure you and the fetus are growing normally.  Your blood pressure will be taken.  Your abdomen will be measured to track your baby's growth.  The fetal heartbeat will be listened to.  Any test results from the previous visit will be discussed. Your health care provider may ask you:  How you are feeling.  If you are feeling the baby move.  If you have had any abnormal symptoms, such as leaking fluid, bleeding, severe headaches, or abdominal cramping.  If you are using any tobacco products, including cigarettes, chewing tobacco, and electronic cigarettes.  If you have any questions. Other tests that may be performed during your second trimester include:  Blood tests that check for:  Low iron levels (anemia).  Gestational diabetes (between 24 and 28 weeks).  Rh antibodies.  Urine tests to check for infections, diabetes, or protein in the urine.  An ultrasound to confirm the proper growth and development of the baby.  An amniocentesis to check for possible genetic problems.  Fetal screens for spina bifida   and Down syndrome.  HIV (human immunodeficiency virus) testing. Routine prenatal testing includes screening for HIV, unless you choose not to have this test. HOME CARE INSTRUCTIONS   Avoid all smoking, herbs, alcohol, and unprescribed drugs. These chemicals affect the formation and growth of the baby.  Do not use any tobacco products, including cigarettes, chewing tobacco, and electronic cigarettes. If you need help quitting, ask your health care provider. You may receive  counseling support and other resources to help you quit.  Follow your health care provider's instructions regarding medicine use. There are medicines that are either safe or unsafe to take during pregnancy.  Exercise only as directed by your health care provider. Experiencing uterine cramps is a good sign to stop exercising.  Continue to eat regular, healthy meals.  Wear a good support bra for breast tenderness.  Do not use hot tubs, steam rooms, or saunas.  Wear your seat belt at all times when driving.  Avoid raw meat, uncooked cheese, cat litter boxes, and soil used by cats. These carry germs that can cause birth defects in the baby.  Take your prenatal vitamins.  Take 1500-2000 mg of calcium daily starting at the 20th week of pregnancy until you deliver your baby.  Try taking a stool softener (if your health care provider approves) if you develop constipation. Eat more high-fiber foods, such as fresh vegetables or fruit and whole grains. Drink plenty of fluids to keep your urine clear or pale yellow.  Take warm sitz baths to soothe any pain or discomfort caused by hemorrhoids. Use hemorrhoid cream if your health care provider approves.  If you develop varicose veins, wear support hose. Elevate your feet for 15 minutes, 3-4 times a day. Limit salt in your diet.  Avoid heavy lifting, wear low heel shoes, and practice good posture.  Rest with your legs elevated if you have leg cramps or low back pain.  Visit your dentist if you have not gone yet during your pregnancy. Use a soft toothbrush to brush your teeth and be gentle when you floss.  A sexual relationship may be continued unless your health care provider directs you otherwise.  Continue to go to all your prenatal visits as directed by your health care provider. SEEK MEDICAL CARE IF:   You have dizziness.  You have mild pelvic cramps, pelvic pressure, or nagging pain in the abdominal area.  You have persistent nausea,  vomiting, or diarrhea.  You have a bad smelling vaginal discharge.  You have pain with urination. SEEK IMMEDIATE MEDICAL CARE IF:   You have a fever.  You are leaking fluid from your vagina.  You have spotting or bleeding from your vagina.  You have severe abdominal cramping or pain.  You have rapid weight gain or loss.  You have shortness of breath with chest pain.  You notice sudden or extreme swelling of your face, hands, ankles, feet, or legs.  You have not felt your baby move in over an hour.  You have severe headaches that do not go away with medicine.  You have vision changes.   This information is not intended to replace advice given to you by your health care provider. Make sure you discuss any questions you have with your health care provider.   Document Released: 07/08/2001 Document Revised: 08/04/2014 Document Reviewed: 09/14/2012 Elsevier Interactive Patient Education 2016 [employer]    Safe Medications in Pregnancy   Acne: Benzoyl Peroxide Salicylic Acid  Backache/Headache: Tylenol: 2 regular strength every 4   hours OR              2 Extra strength every 6 hours  Colds/Coughs/Allergies: Benadryl (alcohol free) 25 mg every 6 hours as needed Breath right strips Claritin Cepacol throat lozenges Chloraseptic throat spray Cold-Eeze- up to three times per day Cough drops, alcohol free Flonase (by prescription only) Guaifenesin Mucinex Robitussin DM (plain only, alcohol free) Saline nasal spray/drops Sudafed (pseudoephedrine) & Actifed ** use only after [redacted] weeks gestation and if you do not have high blood pressure Tylenol Vicks Vaporub Zinc lozenges Zyrtec   Constipation: Colace Ducolax suppositories Fleet enema Glycerin suppositories Metamucil Milk of magnesia Miralax Senokot Smooth move tea  Diarrhea: Kaopectate Imodium A-D  *NO pepto Bismol  Hemorrhoids: Anusol Anusol HC Preparation  H Tucks  Indigestion: Tums Maalox Mylanta Zantac  Pepcid  Insomnia: Benadryl (alcohol free) 25mg every 6 hours as needed Tylenol PM Unisom, no Gelcaps  Leg Cramps: Tums MagGel  Nausea/Vomiting:  Bonine Dramamine Emetrol Ginger extract Sea bands Meclizine  Nausea medication to take during pregnancy:  Unisom (doxylamine succinate 25 mg tablets) Take one tablet daily at bedtime. If symptoms are not adequately controlled, the dose can be increased to a maximum recommended dose of two tablets daily (1/2 tablet in the morning, 1/2 tablet mid-afternoon and one at bedtime). Vitamin B6 100mg tablets. Take one tablet twice a day (up to 200 mg per day).  Skin Rashes: Aveeno products Benadryl cream or 25mg every 6 hours as needed Calamine Lotion 1% cortisone cream  Yeast infection: Gyne-lotrimin 7 Monistat 7   **If taking multiple medications, please check labels to avoid duplicating the same active ingredients **take medication as directed on the label ** Do not exceed 4000 mg of tylenol in 24 hours **Do not take medications that contain aspirin or ibuprofen     

## 2015-07-10 NOTE — Progress Notes (Signed)
   Subjective:    Erica Parks is a G1P0 [id number] being seen today for her first obstetrical visit.  Her obstetrical history is significant for none. Patient does intend to breast feed. Pregnancy history fully reviewed.  Patient reports spitting.  Patient states was seen at ED last month for episode of bleeding. Bedside informal ultrasound was done by provider. Pt states was told she could not see the placenta & could possibly have placenta previa. Patient declines pelvic exam today until her anatomy scan is done.   Filed Vitals:   07/10/15 1252  BP: 107/58  Pulse: 78  Weight: 187 lb 6.4 oz (85.004 kg)    HISTORY: OB History  Gravida Para Term Preterm AB SAB TAB Ectopic Multiple Living  8 5 5  2 1 1   5     # Outcome Date GA Lbr Len/2nd Weight Sex Delivery Anes PTL Lv  8 Current           7 SAB 10/2013 [id number]         6 Term 2012     Vag-Spont   Y  5 Term 2011     Vag-Spont   Y  4 TAB 2009          3 Term 2007     Vag-Spont   Y  2 Term 2005     Vag-Spont   Y  1 Term 2003     Vag-Spont   Y     No past medical history on file. Past Surgical History  Procedure Laterality Date  . Appendectomy     History reviewed. No pertinent family history.   Exam    Uterus:   fundal height at umbilicus  Pelvic Exam: Deferred per patient request   Skin: normal coloration and turgor, no rashes    Neurologic: oriented, normal   Extremities: normal strength, tone, and muscle mass   HEENT sclera clear, anicteric   Mouth/Teeth mucous membranes moist, pharynx normal without lesions and dental hygiene good   Neck supple and no masses   Cardiovascular: regular rate and rhythm   Respiratory:  appears well, vitals normal, no respiratory distress, acyanotic, normal RR, neck free of mass or lymphadenopathy, chest clear, no wheezing, crepitations, rhonchi, normal symmetric air entry   Abdomen: soft, non-tender; bowel sounds normal; no masses,  no organomegaly and scar from appendectomy       Assessment:    Pregnancy: G1P0 Patient Active Problem List   Diagnosis Date Noted  . Supervision of normal pregnancy in second trimester 07/10/2015        Plan:  1. Supervision of normal pregnancy in second trimester  - Prenatal Profile - Glucose Tolerance, 1 HR (50g) w/o Fasting - Cytology - PAP - US MFM OB COMP + 14 WK; Future - AFP, Quad Screen  2. Excessive salivation while pregnant, second trimester  - glycopyrrolate (ROBINUL) 1 MG tablet; Take 1 tablet (1 mg total) by mouth 3 (three) times daily.  Dispense: 90 tablet; Refill: 0     Initial labs drawn. Prenatal vitamins. Problem list reviewed and updated. Genetic Screening discussed Quad Screen: ordered.  Ultrasound discussed; fetal survey: ordered.  Follow up in 4 weeks.    Judeth Hornrin Deovion Batrez 07/10/2015

## 2015-07-11 LAB — PRENATAL PROFILE (SOLSTAS)
ANTIBODY SCREEN: NEGATIVE
BASOS PCT: 0 % (ref 0–1)
Basophils Absolute: 0 10*3/uL (ref 0.0–0.1)
EOS ABS: 0 10*3/uL (ref 0.0–0.7)
Eosinophils Relative: 0 % (ref 0–5)
HEMATOCRIT: 32 % — AB (ref 36.0–46.0)
HEMOGLOBIN: 10.1 g/dL — AB (ref 12.0–15.0)
HEP B S AG: NEGATIVE
HIV 1&2 Ab, 4th Generation: NONREACTIVE
Lymphocytes Relative: 15 % (ref 12–46)
Lymphs Abs: 1.4 10*3/uL (ref 0.7–4.0)
MCH: 28.9 pg (ref 26.0–34.0)
MCHC: 31.6 g/dL (ref 30.0–36.0)
MCV: 91.4 fL (ref 78.0–100.0)
MONO ABS: 0.7 10*3/uL (ref 0.1–1.0)
MPV: 8.9 fL (ref 8.6–12.4)
Monocytes Relative: 8 % (ref 3–12)
NEUTROS PCT: 77 % (ref 43–77)
Neutro Abs: 6.9 10*3/uL (ref 1.7–7.7)
Platelets: 329 10*3/uL (ref 150–400)
RBC: 3.5 MIL/uL — AB (ref 3.87–5.11)
RDW: 15 % (ref 11.5–15.5)
Rh Type: POSITIVE
Rubella: 0.82 Index (ref <0.90)
WBC: 9 10*3/uL (ref 4.0–10.5)

## 2015-07-11 LAB — AFP, QUAD SCREEN
AFP: 58.2 ng/mL
CURR GEST AGE: 19.3 wks.days
HCG TOTAL: 31.45 [IU]/mL
INH: 192.3 pg/mL
Interpretation-AFP: NEGATIVE
MOM FOR INH: 1.22
MoM for AFP: 1.12
MoM for hCG: 1.53
Open Spina bifida: NEGATIVE
Tri 18 Scr Risk Est: NEGATIVE
UE3 MOM: 0.86
UE3 VALUE: 1.37 ng/mL

## 2015-07-11 LAB — GLUCOSE TOLERANCE, 1 HOUR (50G) W/O FASTING: Glucose, 1 Hour GTT: 83 mg/dL (ref 70–140)

## 2015-07-16 ENCOUNTER — Ambulatory Visit (HOSPITAL_COMMUNITY)
Admission: RE | Admit: 2015-07-16 | Discharge: 2015-07-16 | Disposition: A | Discharge status: Home / Self Care | Payer: Self-pay | Source: Ambulatory Visit | Attending: Student | Admitting: Student

## 2015-07-16 ENCOUNTER — Other Ambulatory Visit: Payer: Self-pay | Admitting: Student

## 2015-07-16 DIAGNOSIS — Z36 Encounter for antenatal screening of mother: Secondary | ICD-10-CM | POA: Insufficient documentation

## 2015-07-16 DIAGNOSIS — Z3A20 20 weeks gestation of pregnancy: Secondary | ICD-10-CM | POA: Insufficient documentation

## 2015-07-16 DIAGNOSIS — Z3492 Encounter for supervision of normal pregnancy, unspecified, second trimester: Secondary | ICD-10-CM

## 2015-08-07 ENCOUNTER — Ambulatory Visit (INDEPENDENT_AMBULATORY_CARE_PROVIDER_SITE_OTHER): Payer: Self-pay | Admitting: Advanced Practice Midwife

## 2015-08-07 ENCOUNTER — Encounter: Payer: Self-pay | Admitting: Advanced Practice Midwife

## 2015-08-07 VITALS — BP 116/60 | HR 85 | Temp 97.8°F | Wt 193.4 lb

## 2015-08-07 DIAGNOSIS — Z3492 Encounter for supervision of normal pregnancy, unspecified, second trimester: Secondary | ICD-10-CM

## 2015-08-07 DIAGNOSIS — Z3482 Encounter for supervision of other normal pregnancy, second trimester: Secondary | ICD-10-CM

## 2015-08-07 DIAGNOSIS — IMO0002 Reserved for concepts with insufficient information to code with codable children: Secondary | ICD-10-CM

## 2015-08-07 DIAGNOSIS — Z0489 Encounter for examination and observation for other specified reasons: Secondary | ICD-10-CM

## 2015-08-07 LAB — POCT URINALYSIS DIP (DEVICE)
BILIRUBIN URINE: NEGATIVE
BILIRUBIN URINE: NEGATIVE
GLUCOSE, UA: NEGATIVE mg/dL
Glucose, UA: NEGATIVE mg/dL
KETONES UR: NEGATIVE mg/dL
Ketones, ur: NEGATIVE mg/dL
LEUKOCYTES UA: NEGATIVE
LEUKOCYTES UA: NEGATIVE
NITRITE: NEGATIVE
Nitrite: NEGATIVE
Protein, ur: NEGATIVE mg/dL
Protein, ur: NEGATIVE mg/dL
SPECIFIC GRAVITY, URINE: 1.015 (ref 1.005–1.030)
Specific Gravity, Urine: 1.015 (ref 1.005–1.030)
UROBILINOGEN UA: 0.2 mg/dL (ref 0.0–1.0)
Urobilinogen, UA: 0.2 mg/dL (ref 0.0–1.0)
pH: 8.5 — ABNORMAL HIGH (ref 5.0–8.0)
pH: 8.5 — ABNORMAL HIGH (ref 5.0–8.0)

## 2015-08-07 NOTE — Progress Notes (Signed)
Pt states she is having some painful bowels movements.

## 2015-08-07 NOTE — Patient Instructions (Addendum)
Tdap Vaccine (Tetanus, Diphtheria and Pertussis): What You Need to Know 1. Why get vaccinated? Tetanus, diphtheria and pertussis are very serious diseases. Tdap vaccine can protect us from these diseases. And, Tdap vaccine given to pregnant women can protect newborn babies against pertussis. TETANUS (Lockjaw) is rare in the [place] today. It causes painful muscle tightening and stiffness, usually all over the body.  It can lead to tightening of muscles in the head and neck so you can't open your mouth, swallow, or sometimes even breathe. Tetanus kills about 1 out of 10 people who are infected even after receiving the best medical care. DIPHTHERIA is also rare in the [place] today. It can cause a thick coating to form in the back of the throat.  It can lead to breathing problems, heart failure, paralysis, and death. PERTUSSIS (Whooping Cough) causes severe coughing spells, which can cause difficulty breathing, vomiting and disturbed sleep.  It can also lead to weight loss, incontinence, and rib fractures. Up to 2 in 100 adolescents and 5 in 100 adults with pertussis are hospitalized or have complications, which could include pneumonia or death. These diseases are caused by bacteria. Diphtheria and pertussis are spread from person to person through secretions from coughing or sneezing. Tetanus enters the body through cuts, scratches, or wounds. Before vaccines, as many as 200,000 cases of diphtheria, 200,000 cases of pertussis, and hundreds of cases of tetanus, were reported in the [place] each year. Since vaccination began, reports of cases for tetanus and diphtheria have dropped by about 99% and for pertussis by about 80%. 2. Tdap vaccine Tdap vaccine can protect adolescents and adults from tetanus, diphtheria, and pertussis. One dose of Tdap is routinely given at age 11 or 12. People who did not get Tdap at that age should get it as soon as possible. Tdap is especially important  for healthcare professionals and anyone having close contact with a baby younger than 12 months. Pregnant women should get a dose of Tdap during every pregnancy, to protect the newborn from pertussis. Infants are most at risk for severe, life-threatening complications from pertussis. Another vaccine, called Td, protects against tetanus and diphtheria, but not pertussis. A Td booster should be given every 10 years. Tdap may be given as one of these boosters if you have never gotten Tdap before. Tdap may also be given after a severe cut or burn to prevent tetanus infection. Your doctor or the person giving you the vaccine can give you more information. Tdap may safely be given at the same time as other vaccines. 3. Some people should not get this vaccine  A person who has ever had a life-threatening allergic reaction after a previous dose of any diphtheria, tetanus or pertussis containing vaccine, OR has a severe allergy to any part of this vaccine, should not get Tdap vaccine. Tell the person giving the vaccine about any severe allergies.  Anyone who had coma or long repeated seizures within 7 days after a childhood dose of DTP or DTaP, or a previous dose of Tdap, should not get Tdap, unless a cause other than the vaccine was found. They can still get Td.  Talk to your doctor if you:  have seizures or another nervous system problem,  had severe pain or swelling after any vaccine containing diphtheria, tetanus or pertussis,  ever had a condition called Guillain-Barr Syndrome (GBS),  aren't feeling well on the day the shot is scheduled. 4. Risks With any medicine, including vaccines, there is   a chance of side effects. These are usually mild and go away on their own. Serious reactions are also possible but are rare. Most people who get Tdap vaccine do not have any problems with it. Mild problems following Tdap (Did not interfere with activities)  Pain where the shot was given (about 3 in 4  adolescents or 2 in 3 adults)  Redness or swelling where the shot was given (about 1 person in 5)  Mild fever of at least 100.4F (up to about 1 in 25 adolescents or 1 in 100 adults)  Headache (about 3 or 4 people in 10)  Tiredness (about 1 person in 3 or 4)  Nausea, vomiting, diarrhea, stomach ache (up to 1 in 4 adolescents or 1 in 10 adults)  Chills, sore joints (about 1 person in 10)  Body aches (about 1 person in 3 or 4)  Rash, swollen glands (uncommon) Moderate problems following Tdap (Interfered with activities, but did not require medical attention)  Pain where the shot was given (up to 1 in 5 or 6)  Redness or swelling where the shot was given (up to about 1 in 16 adolescents or 1 in 12 adults)  Fever over 102F (about 1 in 100 adolescents or 1 in 250 adults)  Headache (about 1 in 7 adolescents or 1 in 10 adults)  Nausea, vomiting, diarrhea, stomach ache (up to 1 or 3 people in 100)  Swelling of the entire arm where the shot was given (up to about 1 in 500). Severe problems following Tdap (Unable to perform usual activities; required medical attention)  Swelling, severe pain, bleeding and redness in the arm where the shot was given (rare). Problems that could happen after any vaccine:  People sometimes faint after a medical procedure, including vaccination. Sitting or lying down for about 15 minutes can help prevent fainting, and injuries caused by a fall. Tell your doctor if you feel dizzy, or have vision changes or ringing in the ears.  Some people get severe pain in the shoulder and have difficulty moving the arm where a shot was given. This happens very rarely.  Any medication can cause a severe allergic reaction. Such reactions from a vaccine are very rare, estimated at fewer than 1 in a million doses, and would happen within a few minutes to a few hours after the vaccination. As with any medicine, there is a very remote chance of a vaccine causing a serious  injury or death. The safety of vaccines is always being monitored. For more information, visit: www.cdc.gov/vaccinesafety/ 5. What if there is a serious problem? What should I look for?  Look for anything that concerns you, such as signs of a severe allergic reaction, very high fever, or unusual behavior.  Signs of a severe allergic reaction can include hives, swelling of the face and throat, difficulty breathing, a fast heartbeat, dizziness, and weakness. These would usually start a few minutes to a few hours after the vaccination. What should I do?  If you think it is a severe allergic reaction or other emergency that can't wait, call 9-1-1 or get the person to the nearest hospital. Otherwise, call your doctor.  Afterward, the reaction should be reported to the Vaccine Adverse Event Reporting System (VAERS). Your doctor might file this report, or you can do it yourself through the VAERS web site at www.vaers.hhs.gov, or by calling 1-800-822-7967. VAERS does not give medical advice.  6. The National Vaccine Injury Compensation Program The National Vaccine Injury Compensation Program (  VICP) is a federal program that was created to compensate people who may have been injured by certain vaccines. Persons who believe they may have been injured by a vaccine can learn about the program and about filing a claim by calling 1-(705) 067-1943 or visiting the VICP website at SpiritualWord.atwww.hrsa.gov/vaccinecompensation. There is a time limit to file a claim for compensation. 7. How can I learn more?  Ask your doctor. He or she can give you the vaccine package insert or suggest other sources of information.  Call your local or state health department.  Contact the Centers for Disease Control and Prevention (CDC):  Call 929 050 91431-(989)593-7589 (1-800-CDC-INFO) or  Visit CDC's website at PicCapture.uywww.cdc.gov/vaccines CDC Tdap Vaccine VIS (09/20/13)   This information is not intended to replace advice given to you by your health care  provider. Make sure you discuss any questions you have with your health care provider.   Document Released: 01/13/2012 Document Revised: 08/04/2014 Document Reviewed: 10/26/2013 Elsevier Interactive Patient Education 2016 Else[employer] Inc.   Pregnancy and Influenza Influenza, also called the flu, is an infection of the respiratory tract. If you are pregnant, you are more likely to catch the flu. You are also more likely to have a more serious case of the flu. This is because pregnancy lowers your body's ability to fight off infections (it weakens your immune system). It also puts additional stress on your heart and lungs, which makes you more likely to have complications. Having a bad case of the flu, especially with a high fever, can be dangerous for your developing baby. It can cause you to go into early labor. HOW DO PEOPLE GET THE FLU? The flu is caused by the influenza virus. This virus is common every year in the fall and winter. It spreads when virus particles get passed from person to person. You can get the virus if you are near a sick person who is coughing or sneezing. You can also get the virus if you touch something that has the virus on it and then touch your face. HOW CAN I PROTECT MYSELF AGAINST THE FLU?  Get a flu shot. The best way to prevent the flu is to get a flu shot before flu season starts. The flu shot is not dangerous for your developing baby. It may even help protect your baby from the flu for up to 6 months after birth. The flu shot is one type of flu vaccine. Another type is a nasal spray vaccine. Do not get the nasal spray vaccine. It is not approved for pregnancy.  Do not come in close contact with sick people.  Do not share food, drinks, or utensils with other people.  Wash your hands often. Use hand sanitizer when soap and water are not available. WHAT SHOULD I DO IF I HAVE FLU SYMPTOMS? If you have any flu symptoms, call your health care provider right away. Flu  symptoms include:  Fever or chills.  Muscle aches.  Headache.  Sore throat.  Nasal congestion.  Cough.  Feeling tired.  Loss of appetite.  Vomiting.  Diarrhea. You may be able to take an antiviral medicine to keep the flu from becoming severe and to shorten how long it lasts. WHAT SHOULD I DO AT HOME IF I AM DIAGNOSED WITH THE FLU?  Do not take any medicine, including cold or flu medicine, unless directed by your health care provider.  If you take antiviral medicine, make sure you finish it even if you start to feel better.  Drink enough fluid to keep your urine clear or pale yellow.  Get plenty of rest. WHEN WOULD I SEEK IMMEDIATE MEDICAL CARE IF I HAVE THE FLU?  You have trouble breathing.  You have chest pain.  You begin to have labor pains.  You have a high fever that does not go down after you take medicine.  You do not feel your baby move.  You have diarrhea or vomiting that will not go away.   This information is not intended to replace advice given to you by your health care provider. Make sure you discuss any questions you have with your health care provider.   Document Released: 05/16/2008 Document Revised: 07/19/2013 Document Reviewed: 06/10/2013 Elsevier Interactive Patient Education [employer]2016 Elsevier Inc.

## 2015-08-11 NOTE — Progress Notes (Signed)
Subjective:  Erica Parks is a 35 y.o. [id number] at 6251w3d being seen today for ongoing prenatal care.  She is currently monitored for the following issues for this low-risk pregnancy and has Supervision of normal pregnancy in second trimester on her problem list.  Patient reports Constipation.  Contractions: Not present. Vag. Bleeding: None.  Movement: Present. Denies leaking of fluid.   The following portions of the patient's history were reviewed and updated as appropriate: allergies, current medications, past family history, past medical history, past social history, past surgical history and problem list. Problem list updated.  Objective:   Filed Vitals:   08/07/15 1132  BP: 116/60  Pulse: 85  Temp: 97.8 F (36.6 C)  Weight: 193 lb 6.4 oz (87.726 kg)    Fetal Status: Fetal Heart Rate (bpm): 155   Movement: Present     General:  Alert, oriented and cooperative. Patient is in no acute distress.  Skin: Skin is warm and dry. No rash noted.   Cardiovascular: Normal heart rate noted  Respiratory: Normal respiratory effort, no problems with respiration noted  Abdomen: Soft, gravid, appropriate for gestational age. Pain/Pressure: Present     Pelvic: Vag. Bleeding: None     Cervical exam deferred        Extremities: Normal range of motion.  Edema: None  Mental Status: Normal mood and affect. Normal behavior. Normal judgment and thought content.   Urinalysis: Urine Protein: Trace Urine Glucose: Negative  Assessment and Plan:  Pregnancy: [id number] at 9051w3d  1. Evaluate anatomy not seen on prior sonogram  - US MFM OB FOLLOW UP; Future  2. Supervision of normal pregnancy in second trimester  3. Constipation in pregnancy   - MiraLAX when necessary  Preterm labor symptoms and general obstetric precautions including but not limited to vaginal bleeding, contractions, leaking of fluid and fetal movement were reviewed in detail with the patient. Please refer to After Visit Summary for  other counseling recommendations.  Return for ROB/GTT.   Dorathy KinsmanVirginia Heberto Sturdevant, CNM

## 2015-08-15 ENCOUNTER — Ambulatory Visit (HOSPITAL_COMMUNITY): Payer: Self-pay

## 2015-08-21 ENCOUNTER — Other Ambulatory Visit: Payer: Self-pay | Admitting: Advanced Practice Midwife

## 2015-08-21 ENCOUNTER — Ambulatory Visit (HOSPITAL_COMMUNITY): Admission: RE | Admit: 2015-08-21 | Payer: Self-pay | Source: Ambulatory Visit

## 2015-08-21 DIAGNOSIS — Z3A25 25 weeks gestation of pregnancy: Secondary | ICD-10-CM

## 2015-08-21 DIAGNOSIS — Z0489 Encounter for examination and observation for other specified reasons: Secondary | ICD-10-CM

## 2015-08-21 DIAGNOSIS — IMO0002 Reserved for concepts with insufficient information to code with codable children: Secondary | ICD-10-CM

## 2015-08-28 ENCOUNTER — Encounter: Payer: Self-pay | Admitting: Advanced Practice Midwife

## 2015-09-04 ENCOUNTER — Encounter: Payer: Self-pay | Admitting: Student

## 2016-05-14 ENCOUNTER — Encounter (HOSPITAL_COMMUNITY): Payer: Self-pay
